# Patient Record
Sex: Male | Born: 1935 | Race: Black or African American | Hispanic: No | Marital: Single | Smoking: Never smoker
Health system: Southern US, Community
[De-identification: ages and names within clinical notes are randomized; demographics above are authoritative.]

## PROBLEM LIST (undated history)

## (undated) DIAGNOSIS — E119 Type 2 diabetes mellitus without complications: Secondary | ICD-10-CM

## (undated) DIAGNOSIS — C801 Malignant (primary) neoplasm, unspecified: Secondary | ICD-10-CM

## (undated) DIAGNOSIS — E7800 Pure hypercholesterolemia, unspecified: Secondary | ICD-10-CM

## (undated) DIAGNOSIS — I10 Essential (primary) hypertension: Secondary | ICD-10-CM

## (undated) DIAGNOSIS — I639 Cerebral infarction, unspecified: Secondary | ICD-10-CM

## (undated) HISTORY — PX: PROSTATE SURGERY: SHX751

---

## 2014-10-06 ENCOUNTER — Encounter: Payer: Self-pay | Admitting: Podiatry

## 2014-10-06 ENCOUNTER — Ambulatory Visit (INDEPENDENT_AMBULATORY_CARE_PROVIDER_SITE_OTHER): Payer: Medicare Other | Admitting: Podiatry

## 2014-10-06 VITALS — BP 158/81 | HR 78

## 2014-10-06 DIAGNOSIS — E119 Type 2 diabetes mellitus without complications: Secondary | ICD-10-CM | POA: Insufficient documentation

## 2014-10-06 DIAGNOSIS — M79606 Pain in leg, unspecified: Secondary | ICD-10-CM | POA: Insufficient documentation

## 2014-10-06 DIAGNOSIS — R609 Edema, unspecified: Secondary | ICD-10-CM

## 2014-10-06 DIAGNOSIS — B351 Tinea unguium: Secondary | ICD-10-CM | POA: Insufficient documentation

## 2014-10-06 DIAGNOSIS — R600 Localized edema: Secondary | ICD-10-CM | POA: Insufficient documentation

## 2014-10-06 NOTE — Patient Instructions (Signed)
Seen for hypertrophic nails. All nails debrided. Return in 3 months or as needed.  

## 2015-01-05 ENCOUNTER — Ambulatory Visit: Payer: Medicare Other | Admitting: Podiatry

## 2015-01-12 ENCOUNTER — Ambulatory Visit (INDEPENDENT_AMBULATORY_CARE_PROVIDER_SITE_OTHER): Payer: Medicare Other | Admitting: Podiatry

## 2015-01-12 ENCOUNTER — Encounter: Payer: Self-pay | Admitting: Podiatry

## 2015-01-12 VITALS — BP 157/71 | HR 74

## 2015-01-12 DIAGNOSIS — B351 Tinea unguium: Secondary | ICD-10-CM

## 2015-01-12 DIAGNOSIS — M79606 Pain in leg, unspecified: Secondary | ICD-10-CM

## 2015-01-12 NOTE — Patient Instructions (Signed)
Seen for hypertrophic nails. All nails debrided. Return in 3 months or as needed.  

## 2015-01-12 NOTE — Progress Notes (Signed)
  SUBJECTIVE: 81 y.o. year old male presents for diabetic foot care accompanied by his daughter. Patient is ambulatory using a cane.  His blood sugar this morning was 102.   OBJECTIVE: DERMATOLOGIC EXAMINATION: Thick dystrophic nails x 10.  VASCULAR EXAMINATION OF LOWER LIMBS: Pedal pulses: All pedal pulses are palpable with normal pulsation.  Temperature gradient from tibial crest to dorsum of foot is within normal bilateral. Positive of pedal edema right foot.  NEUROLOGIC EXAMINATION OF THE LOWER LIMBS: Monofilament (Semmes-Weinstein 10-gm) sensory testing positive 6 out of 6, bilateral. Vibratory sensations(128Hz  turning fork) intact at medial and lateral forefoot bilateral.  Sharp and Dull discriminatory sensations at the plantar ball of hallux is intact bilateral.  MUSCULOSKELETAL EXAMINATION: Rectus foot without gross deformity bilateral.   ASSESSMENT: NIDDM under control. Onychomycosis x 10. Painful feet.   PLAN: Debrided all nails. Return in 3 months or as needed

## 2015-04-13 ENCOUNTER — Ambulatory Visit: Payer: Medicare Other | Admitting: Podiatry

## 2015-04-20 ENCOUNTER — Encounter: Payer: Self-pay | Admitting: Podiatry

## 2015-04-20 ENCOUNTER — Ambulatory Visit (INDEPENDENT_AMBULATORY_CARE_PROVIDER_SITE_OTHER): Payer: Medicare Other | Admitting: Podiatry

## 2015-04-20 VITALS — BP 156/75 | HR 70

## 2015-04-20 DIAGNOSIS — B351 Tinea unguium: Secondary | ICD-10-CM | POA: Diagnosis not present

## 2015-04-20 DIAGNOSIS — M79606 Pain in leg, unspecified: Secondary | ICD-10-CM | POA: Diagnosis not present

## 2015-04-20 NOTE — Progress Notes (Signed)
Subjective: 81 y.o. year old male presents for diabetic foot care accompanied by his caretaker. Patient is ambulatory using a cane.  His blood sugar this morning was 99.  OBJECTIVE: DERMATOLOGIC EXAMINATION: Thick dystrophic nails x 10.  VASCULAR EXAMINATION OF LOWER LIMBS: Pedal pulses: All pedal pulses are palpable with normal pulsation.  Temperature gradient from tibial crest to dorsum of foot is within normal bilateral. Positive of pedal edema right foot.  NEUROLOGIC EXAMINATION OF THE LOWER LIMBS: Monofilament (Semmes-Weinstein 10-gm) sensory testing positive 6 out of 6, bilateral. Vibratory sensations(128Hz  turning fork) intact at medial and lateral forefoot bilateral.  Sharp and Dull discriminatory sensations at the plantar ball of hallux is intact bilateral.  MUSCULOSKELETAL EXAMINATION: Rectus foot without gross deformity bilateral.   ASSESSMENT: NIDDM under control. Onychomycosis x 10. Painful feet.   PLAN: Debrided all nails. Return in 3 months or as needed

## 2015-04-20 NOTE — Patient Instructions (Signed)
Seen for hypertrophic nails. All nails debrided. Return in 3 months or as needed.  

## 2015-07-20 ENCOUNTER — Ambulatory Visit (INDEPENDENT_AMBULATORY_CARE_PROVIDER_SITE_OTHER): Payer: Medicare Other | Admitting: Podiatry

## 2015-07-20 ENCOUNTER — Encounter: Payer: Self-pay | Admitting: Podiatry

## 2015-07-20 VITALS — BP 176/84 | HR 65

## 2015-07-20 DIAGNOSIS — B351 Tinea unguium: Secondary | ICD-10-CM | POA: Diagnosis not present

## 2015-07-20 DIAGNOSIS — M79606 Pain in leg, unspecified: Secondary | ICD-10-CM

## 2015-07-20 NOTE — Patient Instructions (Signed)
Seen for hypertrophic nails. All nails debrided. Return in 3 months or as needed.  

## 2015-07-20 NOTE — Progress Notes (Signed)
Subjective: 81 y.o. year old male presents for diabetic foot care accompanied by his caretaker.  His blood sugar is under control.   OBJECTIVE: DERMATOLOGIC EXAMINATION: Thick dystrophic nails x 10.  Both feet are very scaly and dry.  VASCULAR EXAMINATION OF LOWER LIMBS: Pedal pulses: All pedal pulses are palpable with normal pulsation.  Temperature gradient from tibial crest to dorsum of foot is within normal bilateral. NEUROLOGIC EXAMINATION OF THE LOWER LIMBS: All epicritic and tactile sensations grossly intact.  MUSCULOSKELETAL EXAMINATION: Rectus foot without gross deformity bilateral.   ASSESSMENT: NIDDM under control. Onychomycosis x 10.  PLAN: Debrided all nails. Return in 3 months or as needed

## 2015-10-26 ENCOUNTER — Ambulatory Visit: Payer: Medicare Other | Admitting: Podiatry

## 2016-04-22 ENCOUNTER — Ambulatory Visit: Payer: Medicare Other | Admitting: Podiatry

## 2016-04-30 ENCOUNTER — Encounter: Payer: Self-pay | Admitting: Podiatry

## 2016-04-30 ENCOUNTER — Ambulatory Visit (INDEPENDENT_AMBULATORY_CARE_PROVIDER_SITE_OTHER): Payer: Medicare Other | Admitting: Podiatry

## 2016-04-30 VITALS — BP 179/79 | HR 74

## 2016-04-30 DIAGNOSIS — M79606 Pain in leg, unspecified: Secondary | ICD-10-CM

## 2016-04-30 DIAGNOSIS — B351 Tinea unguium: Secondary | ICD-10-CM

## 2016-04-30 DIAGNOSIS — M79673 Pain in unspecified foot: Secondary | ICD-10-CM | POA: Diagnosis not present

## 2016-04-30 NOTE — Patient Instructions (Signed)
Seen for hypertrophic nails. All nails debrided. Return in 3 months or as needed.  

## 2016-04-30 NOTE — Progress Notes (Signed)
Subjective: 82 y.o. year old male presents for diabetic foot care accompanied by his caretaker. His blood sugar is under control, with readings 109, 125.  OBJECTIVE: DERMATOLOGIC EXAMINATION: Thick dystrophic nails x 10.  Both feet are very scaly and dry.  VASCULAR EXAMINATION OF LOWER LIMBS: Pedal pulses: All pedal pulses are palpable with normal pulsation.  Temperature gradient from tibial crest to dorsum of foot is within normal bilateral. NEUROLOGIC EXAMINATION OF THE LOWER LIMBS: All epicritic and tactile sensations grossly intact.  MUSCULOSKELETAL EXAMINATION: Rectus foot without gross deformity bilateral.   ASSESSMENT: NIDDM under control. Onychomycosis x 10.  PLAN: Debrided all nails. Return in 3 months or as needed

## 2016-07-31 ENCOUNTER — Encounter: Payer: Self-pay | Admitting: Podiatry

## 2016-07-31 ENCOUNTER — Ambulatory Visit (INDEPENDENT_AMBULATORY_CARE_PROVIDER_SITE_OTHER): Payer: Self-pay | Admitting: Podiatry

## 2016-07-31 VITALS — BP 179/70 | HR 69

## 2016-07-31 DIAGNOSIS — B351 Tinea unguium: Secondary | ICD-10-CM

## 2016-07-31 DIAGNOSIS — M79606 Pain in leg, unspecified: Secondary | ICD-10-CM

## 2016-07-31 NOTE — Progress Notes (Signed)
Subjective: 82 y.o. year old male presents for diabetic foot care accompanied by his caretaker. His blood sugar is under control, with readings 109, 125.  OBJECTIVE: DERMATOLOGIC EXAMINATION: Thick dystrophic nails x 10.  Both feet are very scaly and dry.  VASCULAR EXAMINATION OF LOWER LIMBS: Pedal pulses: All pedal pulses are palpable with normal pulsation.  Temperature gradient from tibial crest to dorsum of foot is within normal bilateral. NEUROLOGIC EXAMINATION OF THE LOWER LIMBS: All epicritic and tactile sensations grossly intact.  MUSCULOSKELETAL EXAMINATION: Rectus foot without gross deformity bilateral.   ASSESSMENT: NIDDM under control. Onychomycosis x 10.  PLAN: Debrided all nails. Return in 3 months or as needed

## 2016-07-31 NOTE — Patient Instructions (Signed)
Seen for hypertrophic nails. All nails debrided. Return in 3 months or as needed.  

## 2016-10-28 ENCOUNTER — Emergency Department (HOSPITAL_BASED_OUTPATIENT_CLINIC_OR_DEPARTMENT_OTHER): Payer: Medicare HMO

## 2016-10-28 ENCOUNTER — Encounter (HOSPITAL_BASED_OUTPATIENT_CLINIC_OR_DEPARTMENT_OTHER): Payer: Self-pay | Admitting: *Deleted

## 2016-10-28 ENCOUNTER — Emergency Department (HOSPITAL_BASED_OUTPATIENT_CLINIC_OR_DEPARTMENT_OTHER)
Admission: EM | Admit: 2016-10-28 | Discharge: 2016-10-28 | Disposition: A | Discharge status: Home / Self Care | Payer: Medicare HMO | Attending: Emergency Medicine | Admitting: Emergency Medicine

## 2016-10-28 DIAGNOSIS — E119 Type 2 diabetes mellitus without complications: Secondary | ICD-10-CM | POA: Diagnosis not present

## 2016-10-28 DIAGNOSIS — J029 Acute pharyngitis, unspecified: Secondary | ICD-10-CM | POA: Diagnosis present

## 2016-10-28 DIAGNOSIS — R1110 Vomiting, unspecified: Secondary | ICD-10-CM | POA: Insufficient documentation

## 2016-10-28 DIAGNOSIS — Z79899 Other long term (current) drug therapy: Secondary | ICD-10-CM | POA: Insufficient documentation

## 2016-10-28 DIAGNOSIS — Z7982 Long term (current) use of aspirin: Secondary | ICD-10-CM | POA: Diagnosis not present

## 2016-10-28 DIAGNOSIS — R109 Unspecified abdominal pain: Secondary | ICD-10-CM | POA: Diagnosis not present

## 2016-10-28 DIAGNOSIS — Z7984 Long term (current) use of oral hypoglycemic drugs: Secondary | ICD-10-CM | POA: Insufficient documentation

## 2016-10-28 DIAGNOSIS — J069 Acute upper respiratory infection, unspecified: Secondary | ICD-10-CM | POA: Diagnosis not present

## 2016-10-28 DIAGNOSIS — I10 Essential (primary) hypertension: Secondary | ICD-10-CM | POA: Insufficient documentation

## 2016-10-28 DIAGNOSIS — B9789 Other viral agents as the cause of diseases classified elsewhere: Secondary | ICD-10-CM

## 2016-10-28 HISTORY — DX: Cerebral infarction, unspecified: I63.9

## 2016-10-28 HISTORY — DX: Essential (primary) hypertension: I10

## 2016-10-28 HISTORY — DX: Type 2 diabetes mellitus without complications: E11.9

## 2016-10-28 HISTORY — DX: Malignant (primary) neoplasm, unspecified: C80.1

## 2016-10-28 HISTORY — DX: Pure hypercholesterolemia, unspecified: E78.00

## 2016-10-28 LAB — RAPID STREP SCREEN (MED CTR MEBANE ONLY): STREPTOCOCCUS, GROUP A SCREEN (DIRECT): NEGATIVE

## 2016-10-28 MED ORDER — BENZONATATE 100 MG PO CAPS: 100.0000 mg | ORAL_CAPSULE | Freq: Three times a day (TID) | ORAL | 0 refills | Status: AC

## 2016-10-28 NOTE — Discharge Instructions (Signed)

## 2016-10-28 NOTE — ED Provider Notes (Signed)
Emergency Department Provider Note  By signing my name below, I, Kurt Ray, attest that this documentation has been prepared under the direction and in the presence of Kurt Fast, MD . Electronically Signed: [name], Scribe. 10/28/2016. 6:18 PM.  I have reviewed the triage vital signs and the nursing notes.   HISTORY  Chief Complaint Sore Throat   HPI Kurt Ray is a 82 y.o. male with pmhx of DM, HTN, and CA who presents to the Emergency Department complaining of sudden-onset constant unchanged sore throat beginning today. Pt's relative states they have tried soup and fluids with minimal relief. No other modifying factors indicated. They report associated occasional vomiting, especially after eating, productive cough with yellow sputum, and abdominal pain. He denies any CP, SOB, appetite change, confusion, fever, chills or diarrhea.   Past Medical History:  Diagnosis Date  . Cancer ([place])   . Diabetes mellitus without complication ([place])   . High cholesterol   . Hypertension   . Stroke [facility])     Patient Active Problem List   Diagnosis Date Noted  . Onychomycosis 10/06/2014  . Pain in lower limb 10/06/2014  . Diabetes mellitus with no complication ([place]) [id number]  . Edema of right foot 10/06/2014    Past Surgical History:  Procedure Laterality Date  . PROSTATE SURGERY      Current Outpatient Rx  . Order #: [id number] Class: Historical Med  . Order #: [id number] Class: Historical Med  . Order #: [id number] Class: Historical Med  . Order #: [id number] Class: Print  . Order #: [id number] Class: Historical Med  . Order #: [id number] Class: Historical Med    Allergies Eggs or egg-derived products and Penicillins  No family history on file.  Social History Social History  Substance Use Topics  . Smoking status: Never Smoker  . Smokeless tobacco: Never Used  . Alcohol use No    Review of Systems  Constitutional: No fever/chills. No appetite change. Eyes:  No visual changes. ENT: Positive sore throat. Positive cough Cardiovascular: Denies chest pain. Respiratory: Denies shortness of breath. Gastrointestinal: Positive abdominal pain. Positive vomiting.  No nausea.   No diarrhea.  No constipation. Genitourinary: Negative for dysuria. Musculoskeletal: Negative for back pain. Skin: Negative for rash. Neurological: Negative for headaches, focal weakness or numbness. No confusion. 10-point ROS otherwise negative.  ____________________________________________   PHYSICAL EXAM:  VITAL SIGNS: ED Triage Vitals [10/28/16 1700]  Enc Vitals Group     BP 141/68     Pulse Rate 77     Resp 18     Temp 97.5 F (36.4 C)     Temp Source Oral     SpO2 98 %     Weight 198 lb (89.8 kg)     Height 5\' 8"  (1.727 m)    Constitutional: Alert and oriented. Well appearing and in no acute distress. Eyes: Conjunctivae are normal.  Head: Atraumatic. Nose: No congestion/rhinnorhea. Mouth/Throat: Mucous membranes are moist.  Oropharynx non-erythematous. No exudate or PTA.  Neck: No stridor.   Cardiovascular: Normal rate, regular rhythm. Good peripheral circulation. Grossly normal heart sounds.   Respiratory: Normal respiratory effort.  No retractions. Lungs CTAB. Gastrointestinal: Soft and nontender. No distention.  Musculoskeletal: No lower extremity tenderness nor edema. No gross deformities of extremities. Neurologic:  Normal speech and language. No gross focal neurologic deficits are appreciated.  Skin:  Skin is warm, dry and intact. No rash noted.   ____________________________________________   LABS (all labs ordered are listed, but only abnormal results are displayed)  Labs  Reviewed  RAPID STREP SCREEN (NOT AT [facility])  CULTURE, GROUP A STREP [facility])   ____________________________________________  RADIOLOGY  Dg Chest 2 View  Result Date: 10/28/2016 CLINICAL DATA:  Productive cough. EXAM: CHEST  2 VIEW COMPARISON:  None. FINDINGS: The heart  size and mediastinal contours are within normal limits. No pneumothorax or pleural effusion is noted. Mild bibasilar subsegmental atelectasis or infiltrates are noted. The visualized skeletal structures are unremarkable. IMPRESSION: Mild bibasilar subsegmental atelectasis or infiltrates. Electronically Signed   By: Marijo Conception, M.D.   On: 10/28/2016 19:22    ____________________________________________   PROCEDURES  Procedure(s) performed:   Procedures  None ____________________________________________   INITIAL IMPRESSION / ASSESSMENT AND PLAN / ED COURSE  Pertinent labs & imaging results that were available during my care of the patient were reviewed by me and considered in my medical decision making (see chart for details).  Patient presents to the ED with caregiver concerned for strep throat. No fever or chills at home. Does have an associated cough. Continues to eat and drink well. No fever or AMS at home. Complaining of sore throat currently. No voice changes. No choking episodes. Rapid strep negative from triage. CXR with some bibasilar atelectasis with questionable infiltrates. I reviewed the scan and feel that this is likely atelectasis and risk of abx would outweigh potential benefit. Discussed CXR results with pt and caregiver. Advised PCP follow up. Discharge with symptomatic treatment and return precautions.   At this time, I do not feel there is any life-threatening condition present. I have reviewed and discussed all results (EKG, imaging, lab, urine as appropriate), exam findings with patient. I have reviewed nursing notes and appropriate previous records.  I feel the patient is safe to be discharged home without further emergent workup. Discussed usual and customary return precautions. Patient and family (if present) verbalize understanding and are comfortable with this plan.  Patient will follow-up with their primary care provider. If they do not have a primary care  provider, information for follow-up has been provided to them. All questions have been answered.  ____________________________________________  FINAL CLINICAL IMPRESSION(S) / ED DIAGNOSES  Final diagnoses:  Viral URI with cough     MEDICATIONS GIVEN DURING THIS VISIT:  None  NEW OUTPATIENT MEDICATIONS STARTED DURING THIS VISIT:  Discharge Medication List as of 10/28/2016  7:37 PM    START taking these medications   Details  benzonatate (TESSALON) 100 MG capsule Take 1 capsule (100 mg total) by mouth every 8 (eight) hours., Starting Tue 10/28/2016, Print        Note:  This document was prepared using Dragon voice recognition software and may include unintentional dictation errors.  Nanda Quinton, MD Emergency Medicine  I personally performed the services described in this documentation, which was scribed in my presence. The recorded information has been reviewed and is accurate.       Kurt Fast, MD 10/29/16 [phone]

## 2016-10-28 NOTE — ED Notes (Signed)
ED Provider at bedside. 

## 2016-10-28 NOTE — ED Triage Notes (Signed)
Sore throat and cough with yellow sputum since earlier today.

## 2016-10-28 NOTE — ED Notes (Signed)
Caregiver verbalizes understanding of d/c instructions and denies any further needs at this time. 

## 2016-10-30 ENCOUNTER — Ambulatory Visit: Payer: Self-pay | Admitting: Podiatry

## 2016-10-31 LAB — CULTURE, GROUP A STREP (THRC)

## 2017-06-25 IMAGING — DX DG CHEST 2V
2 series · 2 of 2 positions shown · non-contrast
Comparison: None.

CLINICAL DATA: Productive cough.

EXAM:
CHEST  2 VIEW

[chest lat]
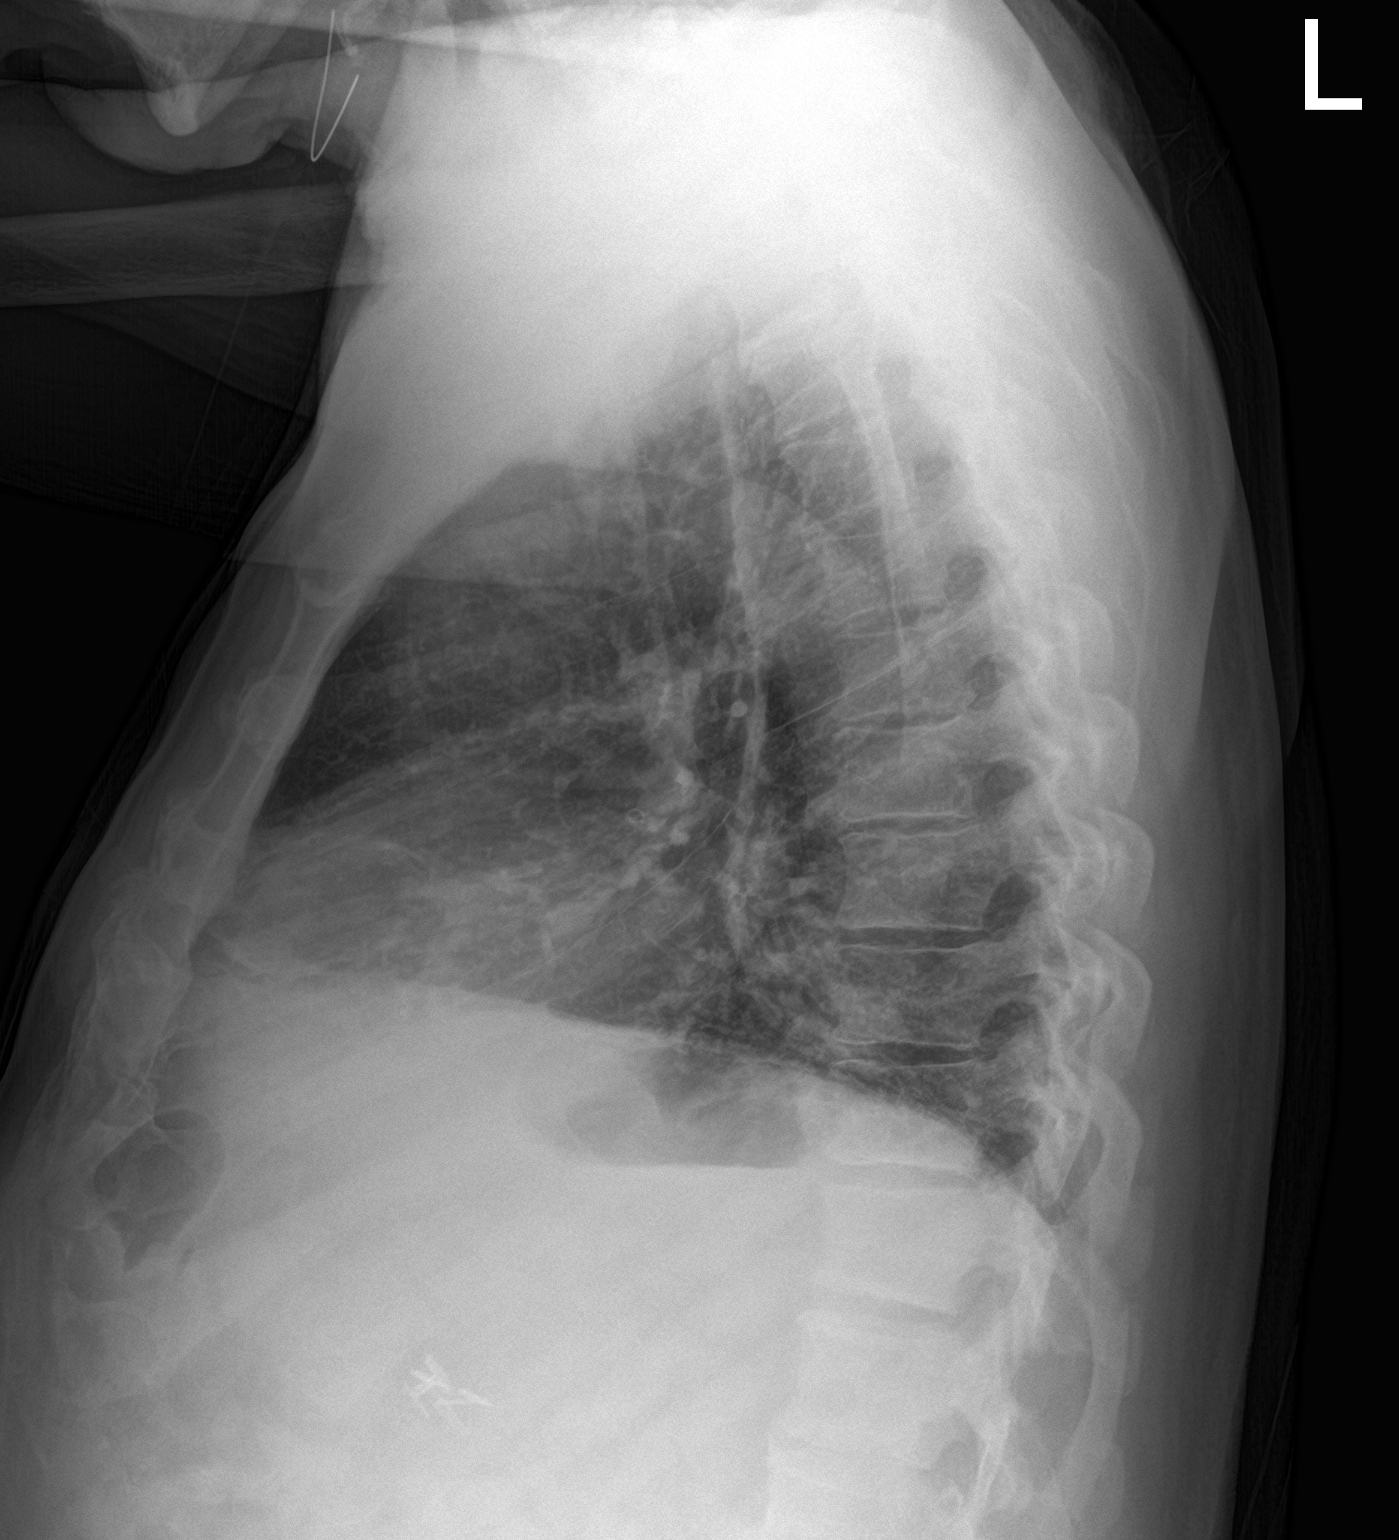

[chest ap]
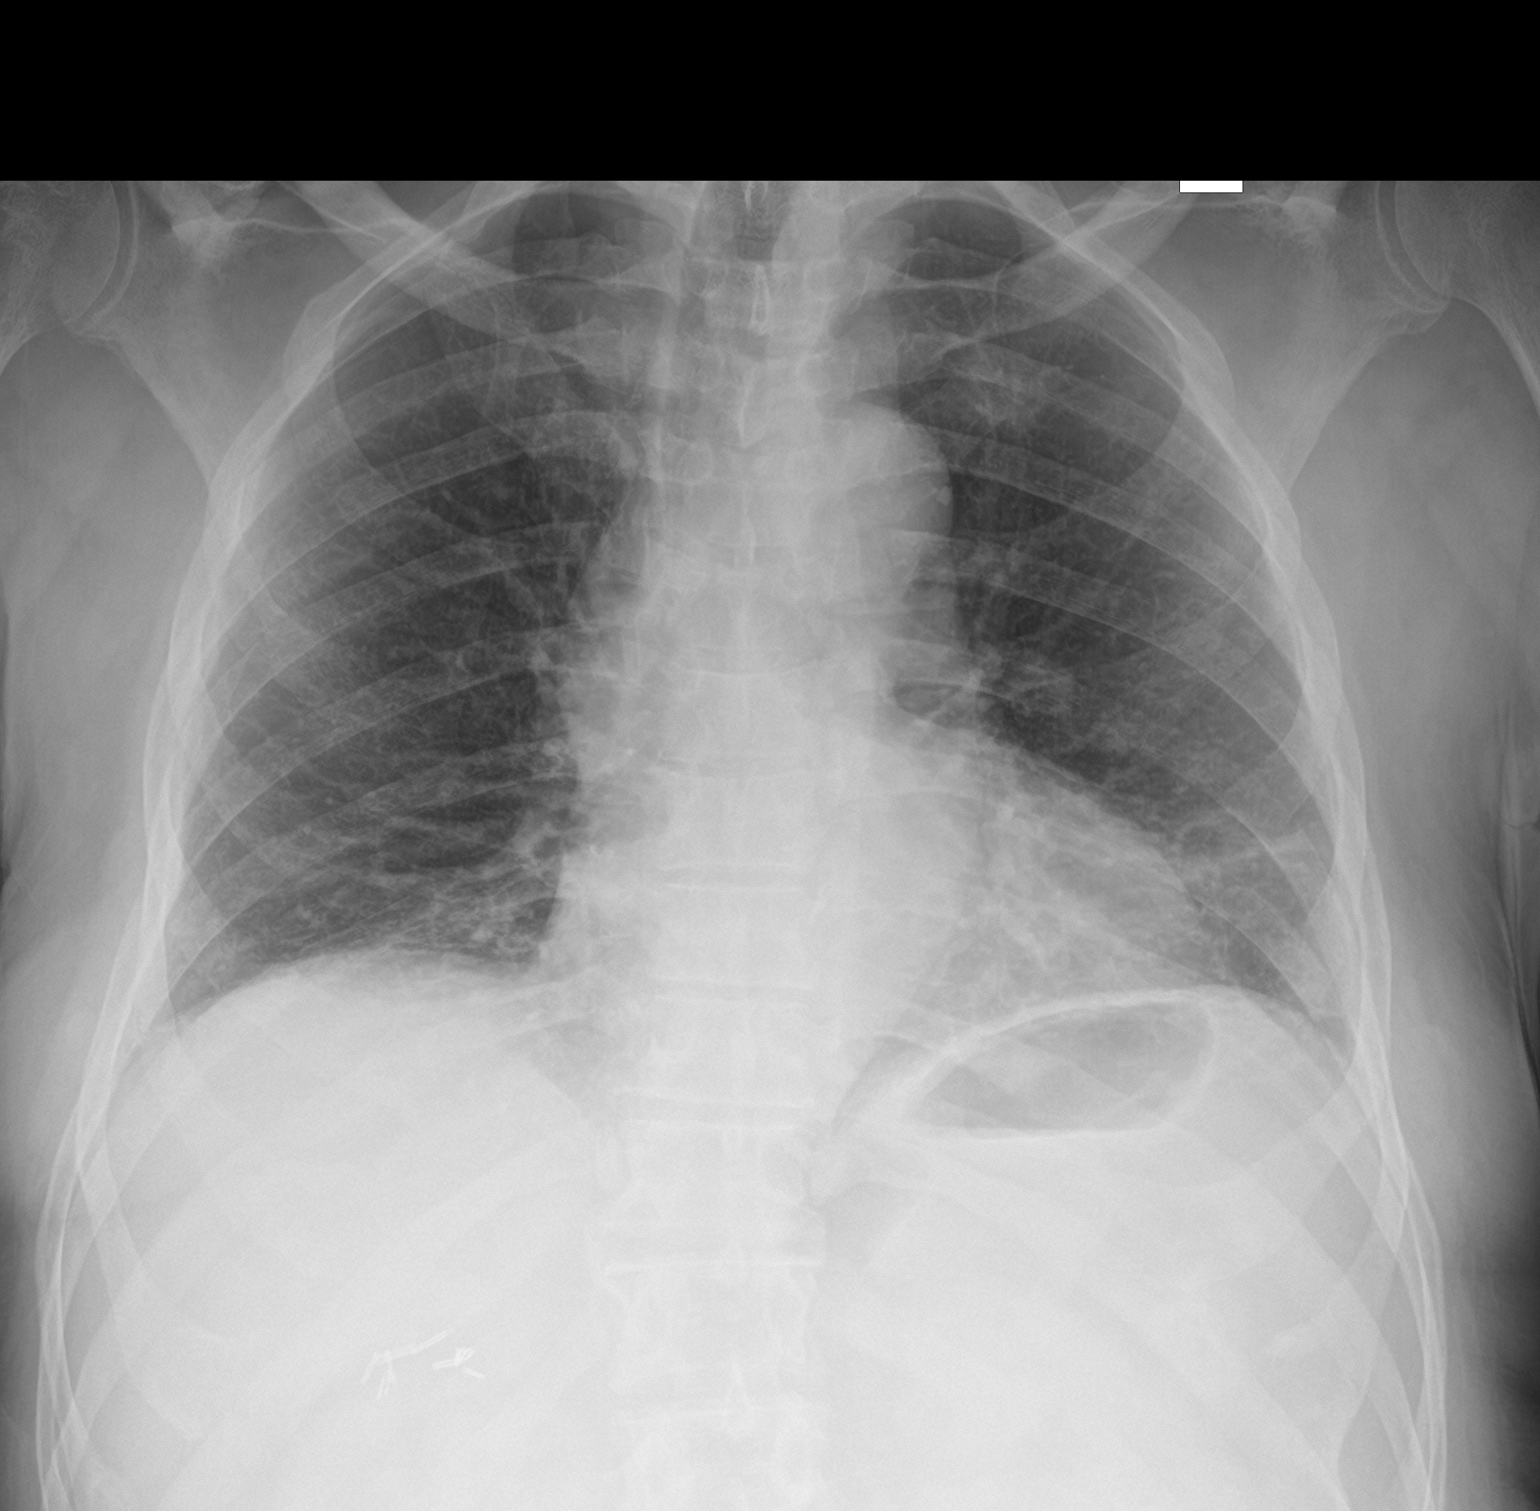

[2 of 2 positions shown; findings below may reference images not displayed]

FINDINGS: The heart size and mediastinal contours are within normal limits. No
pneumothorax or pleural effusion is noted. Mild bibasilar
subsegmental atelectasis or infiltrates are noted. The visualized
skeletal structures are unremarkable.
IMPRESSION: Mild bibasilar subsegmental atelectasis or infiltrates.

## 2022-01-22 DEATH — deceased
